# Patient Record
Sex: Female | Born: 1945 | Race: White | Hispanic: No | Smoking: Current every day smoker
Health system: Southern US, Community
[De-identification: ages and names within clinical notes are randomized; demographics above are authoritative.]

## PROBLEM LIST (undated history)

## (undated) DIAGNOSIS — E785 Hyperlipidemia, unspecified: Secondary | ICD-10-CM

## (undated) DIAGNOSIS — C787 Secondary malignant neoplasm of liver and intrahepatic bile duct: Secondary | ICD-10-CM

## (undated) DIAGNOSIS — F419 Anxiety disorder, unspecified: Secondary | ICD-10-CM

## (undated) DIAGNOSIS — I10 Essential (primary) hypertension: Secondary | ICD-10-CM

## (undated) HISTORY — DX: Essential (primary) hypertension: I10

## (undated) HISTORY — DX: Anxiety disorder, unspecified: F41.9

## (undated) HISTORY — DX: Hyperlipidemia, unspecified: E78.5

## (undated) HISTORY — DX: Secondary malignant neoplasm of liver and intrahepatic bile duct: C78.7

---

## 2018-02-15 ENCOUNTER — Encounter: Payer: Self-pay | Admitting: Hematology

## 2018-02-15 ENCOUNTER — Telehealth: Payer: Self-pay | Admitting: Hematology

## 2018-02-15 ENCOUNTER — Other Ambulatory Visit: Payer: Self-pay | Admitting: Hematology

## 2018-02-15 DIAGNOSIS — C787 Secondary malignant neoplasm of liver and intrahepatic bile duct: Secondary | ICD-10-CM

## 2018-02-15 DIAGNOSIS — C7B8 Other secondary neuroendocrine tumors: Secondary | ICD-10-CM | POA: Insufficient documentation

## 2018-02-15 DIAGNOSIS — I10 Essential (primary) hypertension: Secondary | ICD-10-CM

## 2018-02-15 HISTORY — DX: Essential (primary) hypertension: I10

## 2018-02-15 HISTORY — DX: Secondary malignant neoplasm of liver and intrahepatic bile duct: C78.7

## 2018-02-15 NOTE — Telephone Encounter (Signed)
Spoke to the pt's daughter Morey Hummingbird and scheduled Emily Harper to see Dr. Maylon Peppers on 9/19 at [facility] to arrive 30 minutes early. I provided the address to our office.

## 2018-02-17 ENCOUNTER — Telehealth: Payer: Self-pay | Admitting: Hematology

## 2018-02-17 ENCOUNTER — Telehealth: Payer: Self-pay

## 2018-02-17 ENCOUNTER — Other Ambulatory Visit: Payer: Self-pay | Admitting: Hematology

## 2018-02-17 DIAGNOSIS — C787 Secondary malignant neoplasm of liver and intrahepatic bile duct: Secondary | ICD-10-CM

## 2018-02-17 NOTE — Telephone Encounter (Signed)
Spoke to pts sister regarding upcoming appts per 9/13 sch message

## 2018-02-17 NOTE — Telephone Encounter (Signed)
Called and spoke with patients daughter to advise that patient to come in at 8 AM for a lab appointment prior to her appointment with Dr. Maylon Peppers. Also requested that her son drop off imaging disc on Monday 02/20/18 so that they can be loaded into PACS by 02/23/18. Patient is moving to [place] to live with son but daughter would like to be the contact for patient updates after appointments.

## 2018-02-20 ENCOUNTER — Inpatient Hospital Stay
Admission: RE | Admit: 2018-02-20 | Discharge: 2018-02-20 | Disposition: A | Discharge status: Home / Self Care | Payer: Self-pay | Source: Ambulatory Visit | Attending: Hematology | Admitting: Hematology

## 2018-02-20 ENCOUNTER — Other Ambulatory Visit: Payer: Self-pay | Admitting: Hematology

## 2018-02-20 ENCOUNTER — Ambulatory Visit
Admission: RE | Admit: 2018-02-20 | Discharge: 2018-02-20 | Disposition: A | Discharge status: Home / Self Care | Payer: Self-pay | Source: Ambulatory Visit | Attending: Hematology | Admitting: Hematology

## 2018-02-20 DIAGNOSIS — C801 Malignant (primary) neoplasm, unspecified: Secondary | ICD-10-CM

## 2018-02-23 ENCOUNTER — Inpatient Hospital Stay: Payer: Medicare Other

## 2018-02-23 ENCOUNTER — Encounter: Payer: Self-pay | Admitting: Hematology

## 2018-02-23 ENCOUNTER — Inpatient Hospital Stay: Payer: Medicare Other | Attending: Hematology | Admitting: Hematology

## 2018-02-23 ENCOUNTER — Other Ambulatory Visit: Payer: Self-pay | Admitting: Hematology

## 2018-02-23 ENCOUNTER — Other Ambulatory Visit: Payer: Self-pay

## 2018-02-23 ENCOUNTER — Telehealth: Payer: Self-pay | Admitting: Hematology

## 2018-02-23 VITALS — BP 105/71 | HR 80 | Temp 97.9°F | Resp 18 | Ht 65.5 in | Wt 104.0 lb

## 2018-02-23 DIAGNOSIS — I10 Essential (primary) hypertension: Secondary | ICD-10-CM

## 2018-02-23 DIAGNOSIS — C801 Malignant (primary) neoplasm, unspecified: Secondary | ICD-10-CM | POA: Insufficient documentation

## 2018-02-23 DIAGNOSIS — Z720 Tobacco use: Secondary | ICD-10-CM | POA: Insufficient documentation

## 2018-02-23 DIAGNOSIS — K861 Other chronic pancreatitis: Secondary | ICD-10-CM | POA: Insufficient documentation

## 2018-02-23 DIAGNOSIS — R634 Abnormal weight loss: Secondary | ICD-10-CM | POA: Insufficient documentation

## 2018-02-23 DIAGNOSIS — Z7289 Other problems related to lifestyle: Secondary | ICD-10-CM | POA: Diagnosis not present

## 2018-02-23 DIAGNOSIS — C787 Secondary malignant neoplasm of liver and intrahepatic bile duct: Secondary | ICD-10-CM

## 2018-02-23 DIAGNOSIS — K860 Alcohol-induced chronic pancreatitis: Secondary | ICD-10-CM | POA: Diagnosis not present

## 2018-02-23 DIAGNOSIS — R197 Diarrhea, unspecified: Secondary | ICD-10-CM | POA: Diagnosis not present

## 2018-02-23 DIAGNOSIS — R7989 Other specified abnormal findings of blood chemistry: Secondary | ICD-10-CM | POA: Diagnosis not present

## 2018-02-23 DIAGNOSIS — E785 Hyperlipidemia, unspecified: Secondary | ICD-10-CM | POA: Insufficient documentation

## 2018-02-23 DIAGNOSIS — F419 Anxiety disorder, unspecified: Secondary | ICD-10-CM | POA: Diagnosis not present

## 2018-02-23 LAB — CBC WITH DIFFERENTIAL (CANCER CENTER ONLY)
Basophils Absolute: 0.1 10*3/uL (ref 0.0–0.1)
Basophils Relative: 1 %
Eosinophils Absolute: 0.1 10*3/uL (ref 0.0–0.5)
Eosinophils Relative: 2 %
HCT: 43.1 % (ref 34.8–46.6)
Hemoglobin: 14.8 g/dL (ref 11.6–15.9)
LYMPHS ABS: 1.1 10*3/uL (ref 0.9–3.3)
Lymphocytes Relative: 17 %
MCH: 31.8 pg (ref 25.1–34.0)
MCHC: 34.4 g/dL (ref 31.5–36.0)
MCV: 92.6 fL (ref 79.5–101.0)
MONO ABS: 0.5 10*3/uL (ref 0.1–0.9)
Monocytes Relative: 8 %
Neutro Abs: 4.7 10*3/uL (ref 1.5–6.5)
Neutrophils Relative %: 72 %
Platelet Count: 201 10*3/uL (ref 145–400)
RBC: 4.66 MIL/uL (ref 3.70–5.45)
RDW: 14.1 % (ref 11.2–14.5)
WBC Count: 6.4 10*3/uL (ref 3.9–10.3)

## 2018-02-23 LAB — BASIC METABOLIC PANEL - CANCER CENTER ONLY
Anion gap: 9 (ref 5–15)
BUN: 19 mg/dL (ref 8–23)
CHLORIDE: 96 mmol/L — AB (ref 98–111)
CO2: 32 mmol/L (ref 22–32)
CREATININE: 1.44 mg/dL — AB (ref 0.44–1.00)
Calcium: 10.5 mg/dL — ABNORMAL HIGH (ref 8.9–10.3)
GFR, Est AFR Am: 42 mL/min — ABNORMAL LOW (ref >60)
GFR, Estimated: 36 mL/min — ABNORMAL LOW (ref >60)
Glucose, Bld: 98 mg/dL (ref 70–99)
Potassium: 4.2 mmol/L (ref 3.5–5.1)
SODIUM: 137 mmol/L (ref 135–145)

## 2018-02-23 LAB — PROTIME-INR
INR: 0.92
PROTHROMBIN TIME: 12.3 s (ref 11.4–15.2)

## 2018-02-23 LAB — APTT: aPTT: 30 seconds (ref 24–36)

## 2018-02-23 LAB — HEPATIC FUNCTION PANEL
ALK PHOS: 73 U/L (ref 38–126)
ALT: 21 U/L (ref 0–44)
AST: 27 U/L (ref 15–41)
Albumin: 4.2 g/dL (ref 3.5–5.0)
BILIRUBIN INDIRECT: 0.6 mg/dL (ref 0.3–0.9)
Bilirubin, Direct: 0.1 mg/dL (ref 0.0–0.2)
Total Bilirubin: 0.7 mg/dL (ref 0.3–1.2)
Total Protein: 6.7 g/dL (ref 6.5–8.1)

## 2018-02-23 NOTE — Assessment & Plan Note (Signed)
Patient's creatinine was noted to be mildly elevated 1.4 today. There is no external record of patient's serum chemistry for comparison. I encourage patient to maintain oral hydration, and will monitor the patient's renal function periodically.

## 2018-02-23 NOTE — Progress Notes (Signed)
[facility] NOTE  Patient Care Team: Patient, No Pcp Per as PCP - General (General Practice)  ASSESSMENT & PLAN:  Liver metastases ([place]) I reviewed the patient's external records, including colonoscopy results, CT scans, and PET scan, in detail and the collaborated with the patient and her daughter. Patient reports weight loss of approximately 30 pounds over the past 4 months and persistent diarrhea 5 to 7 times a day.  Interestingly, PET scan did not demonstrate any FDG uptake in the hepatic or splenic lesions. Based on the patient's clinical symptoms and the absence of FDG uptake in hepatic lesions on PET scan, this suggests possible neuroendocrine tumor. I will order CT-guided biopsy of the liver lesion to confirm pathology. I discussed in detail with the patient about some of the risks of liver biopsy, including bleeding, risk infection, and pain.  Patient expressed understanding and was agreeable to proceed with the biopsy. I will also order serum chromogranin A and serotonin levels.  Pending the results of the biopsy, if it does demonstrate neuroendocrine tumor, we will try to coordinate an octreotide scan prior to patient's return visit.  Elevated serum creatinine Patient's creatinine was noted to be mildly elevated 1.4 today. There is no external record of patient's serum chemistry for comparison. I encourage patient to maintain oral hydration, and will monitor the patient's renal function periodically.  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 She denies any heavy alcohol use in the past 2 years. I emphasized with the patient about the importance of alcohol abstinence, given her history of chronic pancreatitis.  Tobacco use I spent some time counseling the patient the importance of tobacco cessation. We discussed common strategies including nicotine patches, Tobacco Quit-line, and other nicotine  replacement products to assist in her effort to quit  she appears interested in cutting back her tobacco use. We will continue to assess her willingness to quit in future visits.   Anxiety Patient takes Valium 3 times a day for her anxiety, which is currently well controlled. I encourage patient to continue to follow-up with her primary care physician closely for the management.   Orders Placed This Encounter  Procedures  . CT BIOPSY    Standing Status:   Future    Standing Expiration Date:   02/23/2019    Order Specific Question:   Lab orders requested (DO NOT place separate lab orders, these will be automatically ordered during procedure specimen collection):    Answer:   Cytology - Non Pap    Comments:   Liver biopsy     Order Specific Question:   Lab orders requested (DO NOT place separate lab orders, these will be automatically ordered during procedure specimen collection):    Answer:   Surgical Pathology    Order Specific Question:   Reason for Exam (SYMPTOM  OR DIAGNOSIS REQUIRED)    Answer:   Liver lesions concerning for metastatic disease    Order Specific Question:   Preferred imaging location?    Answer:   [facility]    Order Specific Question:   Radiology Contrast Protocol - do NOT remove file path    Answer:   [email or web address]  . Chromogranin A    Standing Status:   Future    Number of Occurrences:   1    Standing Expiration Date:   02/23/2019  . Serotonin serum    Standing Status:   Future    Number of Occurrences:  1    Standing Expiration Date:   02/23/2019    A total of more than 60 minutes were spent face-to-face with the patient during this encounter and over half of that time was spent on counseling and coordination of care as outlined above.   All questions were answered. The patient knows to call the clinic with any problems, questions or concerns.  Tentatively return to clinic in 3 weeks to follow-up liver biopsy.  If  liver biopsy demonstrates neuroendocrine tumor, we will try to coordinate an octreotide scan prior to her return visit.  Tish Men, MD 02/23/2018 10:09 AM   CHIEF COMPLAINTS/PURPOSE OF CONSULTATION:  "I am here for my liver cancer"  HISTORY OF PRESENTING ILLNESS:  Emily Harper 72 y.o. female is here because of recently diagnosed hepatic metastases of unknown origin.  Patient reports that primary care physician in [place] first noted her to be having weight loss in May 2019.  During the same month, patient began to experience new onset diarrhea, 3-5 times per day, without any associated abdominal pain, nausea, vomiting, hematochezia, or melena.  The frequency of diarrhea continue to worsen over time and is approximately 5-7 times per day, moderate volume.  She has not taken any over-the-counter medicine for the symptom.  She reports associated weight loss of 30 pounds over the past 4 months, but denies any fever, chill, night sweats, lymphadenopathy, rash, abnormal sweating, or other GI symptoms.  Her appetite has been decreasing due to her fear of eating, which can trigger diarrhea.  I have reviewed her chart and materials related to her cancer extensively and collaborated history with the patient. Summary of oncologic history is as follows:   Liver metastases ([place])   10/05/2017 Miscellaneous    PCP noted pt to be losing weight; pt began to experience new onset diarrhea, 3-5x/day, moderate volume, no hematochezia or melena     12/13/2017 Procedure    Colonoscopy Normal except polyps removed from the cecum and descending colon (path showing tubular adenomas)    12/30/2017 Imaging    CT abdomen/pelvis without contrast (for diarrhea, weight loss) Multiple hypodense lesions within the liver, suspicious for metastatic disease.  Suspect circumferential wall thickening/mass lesion involving the cecum and ascending colon.  Bilateral renal cysts.  Mild anterior bladder wall thickening.   Subcentimeter sclerotic lesion in the sacrum, indeterminate.     01/12/2018 Imaging    CT abdomen/pelvis with contrast  Multiple heterogeneously enhancing hepatic lesions consistent with metastases (largest measuring 4cm in the right lobe) Normal appearing pancreas    01/24/2018 Imaging    CT chest w/ contrast  Several 62mm lung nodules, indeterminate significance     01/30/2018 Imaging    FDG PET: No focal abnormal FDG activity corresponding with the hepatic parenchyma.      MEDICAL HISTORY:  Past Medical History:  Diagnosis Date  . Anxiety   . Hepatic metastases ([place]) 02/15/2018  . Hyperlipemia   . Hypertension 02/15/2018  . Liver metastases ([place]) 02/15/2018    SURGICAL HISTORY: History reviewed. No pertinent surgical history.  SOCIAL HISTORY: Social History   Socioeconomic History  . Marital status: Divorced    Spouse name: Not on file  . Number of children: Not on file  . Years of education: Not on file  . Highest education level: Not on file  Occupational History  . Not on file  Social Needs  . Financial resource strain: Not on file  . Food insecurity:    Worry: Not on file  Inability: Not on file  . Transportation needs:    Medical: Not on file    Non-medical: Not on file  Tobacco Use  . Smoking status: Current Every Day Smoker    Packs/day: 1.00    Types: Cigarettes  . Smokeless tobacco: Never Used  Substance and Sexual Activity  . Alcohol use: Not Currently  . Drug use: Yes    Types: Benzodiazepines  . Sexual activity: Not on file  Lifestyle  . Physical activity:    Days per week: Not on file    Minutes per session: Not on file  . Stress: Not on file  Relationships  . Social connections:    Talks on phone: Not on file    Gets together: Not on file    Attends religious service: Not on file    Active member of club or organization: Not on file    Attends meetings of clubs or organizations: Not on file    Relationship status: Not on file  .  Intimate partner violence:    Fear of current or ex partner: Not on file    Emotionally abused: Not on file    Physically abused: Not on file    Forced sexual activity: Not on file  Other Topics Concern  . Not on file  Social History Narrative  . Not on file    FAMILY HISTORY: History reviewed. No pertinent family history.  ALLERGIES:  is allergic to penicillins.  MEDICATIONS:  Current Outpatient Medications  Medication Sig Dispense Refill  . diazepam (VALIUM) 10 MG tablet Take 10 mg by mouth every 8 (eight) hours as needed for anxiety.    . simvastatin (ZOCOR) 40 MG tablet Take 40 mg by mouth daily.    Marland Kitchen triamterene-hydrochlorothiazide (MAXZIDE-25) 37.5-25 MG tablet Take 1 tablet by mouth daily.     No current facility-administered medications for this visit.     REVIEW OF SYSTEMS:   Constitutional: ( - ) fevers, ( - )  chills , ( - ) night sweats Eyes: ( - ) blurriness of vision, ( - ) double vision, ( - ) watery eyes Ears, nose, mouth, throat, and face: ( - ) mucositis, ( - ) sore throat Respiratory: ( - ) cough, ( - ) dyspnea, ( - ) wheezes Cardiovascular: ( - ) palpitation, ( - ) chest discomfort, ( - ) lower extremity swelling Skin: ( - ) abnormal skin rashes Lymphatics: ( - ) new lymphadenopathy, ( - ) easy bruising Neurological: ( - ) numbness, ( - ) tingling, ( - ) new weaknesses Behavioral/Psych: ( - ) mood change, ( - ) new changes, ( + ) anxiety  All other systems were reviewed with the patient and are negative.  PHYSICAL EXAMINATION: ECOG PERFORMANCE STATUS: 0 - Asymptomatic  Vitals:   02/23/18 0858  BP: 105/71  Pulse: 80  Resp: 18  Temp: 97.9 F (36.6 C)  SpO2: 98%   Filed Weights   02/23/18 0858  Weight: 104 lb (47.2 kg)    GENERAL: alert, no distress and comfortable, thin, older than stated age SKIN:  no rashes or significant lesions, chronic sun-damaged skin EYES: conjunctiva are pink and non-injected, sclera clear OROPHARYNX: no exudate, no  erythema; lips, buccal mucosa, and tongue normal  NECK: supple, non-tender LYMPH:  no palpable lymphadenopathy in the cervical, axillary or inguinal LUNGS: clear to auscultation and percussion with normal breathing effort HEART: regular rate & rhythm and no murmurs and no lower extremity edema ABDOMEN: soft, non-tender, non-distended,  normal bowel sounds Musculoskeletal: no cyanosis of digits and no clubbing  PSYCH: alert & oriented x 3, fluent speech NEURO: no focal motor/sensory deficits  LABORATORY DATA:  I have reviewed the data as listed Lab Results  Component Value Date   WBC 6.4 02/23/2018   HGB 14.8 02/23/2018   HCT 43.1 02/23/2018   MCV 92.6 02/23/2018   PLT 201 02/23/2018   Lab Results  Component Value Date   NA 137 02/23/2018   K 4.2 02/23/2018   CL 96 (L) 02/23/2018   CO2 32 02/23/2018    RADIOGRAPHIC STUDIES: I have personally reviewed the radiological images as listed and agreed with the findings in the report. Ct Outside Films Chest  Result Date: 02/20/2018 This examination belongs to an outside facility and is stored here for comparison purposes only.  Contact the originating outside institution for any associated report or interpretation.   PATHOLOGY: I have reviewed the pathology reports as listed above.

## 2018-02-23 NOTE — Telephone Encounter (Signed)
Appts scheduled avs/calendar printed per 9/19 los

## 2018-02-23 NOTE — Assessment & Plan Note (Signed)
I reviewed the patient's external records, including colonoscopy results, CT scans, and PET scan, in detail and the collaborated with the patient and her daughter. Patient reports weight loss of approximately 30 pounds over the past 4 months and persistent diarrhea 5 to 7 times a day.  Interestingly, PET scan did not demonstrate any FDG uptake in the hepatic or splenic lesions. Based on the patient's clinical symptoms and the absence of FDG uptake in hepatic lesions on PET scan, this suggests possible neuroendocrine tumor. I will order CT-guided biopsy of the liver lesion to confirm pathology. I discussed in detail with the patient about some of the risks of liver biopsy, including bleeding, risk infection, and pain.  Patient expressed understanding and was agreeable to proceed with the biopsy. I will also order serum chromogranin A and serotonin levels.  Pending the results of the biopsy, if it does demonstrate neuroendocrine tumor, we will try to coordinate an octreotide scan prior to patient's return visit.

## 2018-02-23 NOTE — Assessment & Plan Note (Signed)
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 She denies any heavy alcohol use in the past 2 years. I emphasized with the patient about the importance of alcohol abstinence, given her history of chronic pancreatitis.

## 2018-02-23 NOTE — Assessment & Plan Note (Signed)
I spent some time counseling the patient the importance of tobacco cessation. We discussed common strategies including nicotine patches, Tobacco Quit-line, and other nicotine replacement products to assist in her effort to quit  she appears interested in cutting back her tobacco use. We will continue to assess her willingness to quit in future visits.

## 2018-02-23 NOTE — Assessment & Plan Note (Signed)
Patient takes Valium 3 times a day for her anxiety, which is currently well controlled. I encourage patient to continue to follow-up with her primary care physician closely for the management.

## 2018-02-27 ENCOUNTER — Other Ambulatory Visit: Payer: Self-pay | Admitting: Hematology

## 2018-02-27 ENCOUNTER — Encounter: Payer: Self-pay | Admitting: Hematology

## 2018-02-27 LAB — CHROMOGRANIN A: Chromogranin A: 477 nmol/L — ABNORMAL HIGH (ref 0–5)

## 2018-02-27 NOTE — Telephone Encounter (Signed)
This encounter was created in error - please disregard.

## 2018-02-28 LAB — SEROTONIN SERUM: Serotonin, Serum: 2583 ng/mL — ABNORMAL HIGH (ref 0–420)

## 2018-03-07 ENCOUNTER — Ambulatory Visit (HOSPITAL_COMMUNITY): Payer: Medicare Other

## 2018-03-07 ENCOUNTER — Other Ambulatory Visit: Payer: Self-pay | Admitting: Student

## 2018-03-07 ENCOUNTER — Other Ambulatory Visit: Payer: Self-pay | Admitting: Radiology

## 2018-03-08 ENCOUNTER — Ambulatory Visit (HOSPITAL_COMMUNITY)
Admission: RE | Admit: 2018-03-08 | Discharge: 2018-03-08 | Disposition: A | Discharge status: Home / Self Care | Payer: Medicare Other | Source: Ambulatory Visit | Attending: Hematology | Admitting: Hematology

## 2018-03-08 ENCOUNTER — Encounter (HOSPITAL_COMMUNITY): Payer: Self-pay

## 2018-03-08 DIAGNOSIS — C787 Secondary malignant neoplasm of liver and intrahepatic bile duct: Secondary | ICD-10-CM | POA: Insufficient documentation

## 2018-03-08 DIAGNOSIS — Z880 Allergy status to penicillin: Secondary | ICD-10-CM | POA: Diagnosis not present

## 2018-03-08 DIAGNOSIS — K769 Liver disease, unspecified: Secondary | ICD-10-CM | POA: Diagnosis present

## 2018-03-08 DIAGNOSIS — C7A8 Other malignant neuroendocrine tumors: Secondary | ICD-10-CM | POA: Insufficient documentation

## 2018-03-08 DIAGNOSIS — F17210 Nicotine dependence, cigarettes, uncomplicated: Secondary | ICD-10-CM | POA: Insufficient documentation

## 2018-03-08 DIAGNOSIS — F419 Anxiety disorder, unspecified: Secondary | ICD-10-CM | POA: Diagnosis not present

## 2018-03-08 DIAGNOSIS — I10 Essential (primary) hypertension: Secondary | ICD-10-CM | POA: Insufficient documentation

## 2018-03-08 DIAGNOSIS — E785 Hyperlipidemia, unspecified: Secondary | ICD-10-CM | POA: Insufficient documentation

## 2018-03-08 DIAGNOSIS — Z79899 Other long term (current) drug therapy: Secondary | ICD-10-CM | POA: Insufficient documentation

## 2018-03-08 MED ORDER — FENTANYL CITRATE (PF) 100 MCG/2ML IJ SOLN
INTRAMUSCULAR | Status: AC | PRN
  Administered 2018-03-08 (×2): 25 ug via INTRAVENOUS

## 2018-03-08 MED ORDER — MIDAZOLAM HCL 2 MG/2ML IJ SOLN
INTRAMUSCULAR | Status: AC | PRN
  Administered 2018-03-08 (×2): 0.5 mg via INTRAVENOUS

## 2018-03-08 MED ORDER — SODIUM CHLORIDE 0.9 % IV SOLN
INTRAVENOUS | Status: AC | PRN
  Administered 2018-03-08: 10 mL/h via INTRAVENOUS

## 2018-03-08 MED ORDER — LIDOCAINE-EPINEPHRINE 1 %-1:100000 IJ SOLN
INTRAMUSCULAR | Status: AC
  Filled 2018-03-08: qty 1

## 2018-03-08 MED ORDER — GELATIN ABSORBABLE 12-7 MM EX MISC
CUTANEOUS | Status: AC
  Filled 2018-03-08: qty 1

## 2018-03-08 MED ORDER — SODIUM CHLORIDE 0.9 % IV SOLN: INTRAVENOUS | Status: DC

## 2018-03-08 MED ORDER — FENTANYL CITRATE (PF) 100 MCG/2ML IJ SOLN
INTRAMUSCULAR | Status: AC
  Filled 2018-03-08: qty 2

## 2018-03-08 MED ORDER — MIDAZOLAM HCL 2 MG/2ML IJ SOLN
INTRAMUSCULAR | Status: AC
  Filled 2018-03-08: qty 2

## 2018-03-08 NOTE — Sedation Documentation (Signed)
Patient is resting comfortably. 

## 2018-03-08 NOTE — Sedation Documentation (Signed)
Patient denies pain and is resting comfortably.  

## 2018-03-08 NOTE — Procedures (Signed)
Pre Procedure Dx: Liver lesions Post Procedural Dx: Same  Technically successful US guided biopsy of dominant mass within the posterior inferior aspect of the right lobe of the liver.   EBL: None  No immediate complications.   Ronny Bacon, MD Pager #: [phone]

## 2018-03-08 NOTE — H&P (Signed)
Chief Complaint: Patient was seen in consultation today for liver lesion biopsy at the request of Dunn Loring  Referring Physician(s): Zhao,Yan  Supervising Physician: Sandi Mariscal  Patient Status: [facility]  History of Present Illness: Emily Harper is a 72 y.o. female being worked up for liver lesions concerning for metastatic process. She is referred for biopsy PMHx, meds, labs, imaging, allergies reviewed. Feels well, no recent fevers, chills, illness. Has been NPO today as directed. Family at bedside.   Past Medical History:  Diagnosis Date  . Anxiety   . Hepatic metastases ([place]) 02/15/2018  . Hyperlipemia   . Hypertension 02/15/2018  . Liver metastases ([place]) 02/15/2018    History reviewed. No pertinent surgical history.  Allergies: Penicillins  Medications: Prior to Admission medications   Medication Sig Start Date End Date Taking? Authorizing Provider  diazepam (VALIUM) 10 MG tablet Take 10 mg by mouth 3 (three) times daily.    Yes [provider]  latanoprost (XALATAN) 0.005 % ophthalmic solution Place 1 drop into both eyes at bedtime.   Yes [provider]  simvastatin (ZOCOR) 40 MG tablet Take 40 mg by mouth at bedtime.    Yes [provider]  triamterene-hydrochlorothiazide (MAXZIDE-25) 37.5-25 MG tablet Take 1 tablet by mouth daily.   Yes [provider]     History reviewed. No pertinent family history.  Social History   Socioeconomic History  . Marital status: Divorced    Spouse name: Not on file  . Number of children: Not on file  . Years of education: Not on file  . Highest education level: Not on file  Occupational History  . Not on file  Social Needs  . Financial resource strain: Not on file  . Food insecurity:    Worry: Not on file    Inability: Not on file  . Transportation needs:    Medical: Not on file    Non-medical: Not on file  Tobacco Use  . Smoking status: Current Every Day Smoker   Packs/day: 1.00    Types: Cigarettes  . Smokeless tobacco: Never Used  Substance and Sexual Activity  . Alcohol use: Not Currently  . Drug use: Yes    Types: Benzodiazepines  . Sexual activity: Not on file  Lifestyle  . Physical activity:    Days per week: Not on file    Minutes per session: Not on file  . Stress: Not on file  Relationships  . Social connections:    Talks on phone: Not on file    Gets together: Not on file    Attends religious service: Not on file    Active member of club or organization: Not on file    Attends meetings of clubs or organizations: Not on file    Relationship status: Not on file  Other Topics Concern  . Not on file  Social History Narrative  . Not on file     Review of Systems: A 12 point ROS discussed and pertinent positives are indicated in the HPI above.  All other systems are negative.  Review of Systems  Vital Signs: BP 105/70   Pulse 80   Temp 97.9 F (36.6 C) (Oral)   Resp 16   Ht 5' 5.5" (1.664 m)   Wt 45.8 kg   SpO2 100%   BMI 16.55 kg/m   Physical Exam  Constitutional: She is oriented to person, place, and time. She appears well-developed. No distress.  HENT:  Head: Normocephalic.  Mouth/Throat: Oropharynx is  clear and moist.  Neck: Normal range of motion. No JVD present.  Cardiovascular: Normal rate, regular rhythm and normal heart sounds.  Pulmonary/Chest: Effort normal and breath sounds normal. No respiratory distress.  Abdominal: Soft. She exhibits no distension and no mass. There is no tenderness.  Neurological: She is oriented to person, place, and time.  Skin: Skin is warm and dry.  Psychiatric: She has a normal mood and affect. Judgment normal.    Imaging: No results found.  Labs:  CBC: Recent Labs    02/23/18 0835  WBC 6.4  HGB 14.8  HCT 43.1  PLT 201    COAGS: Recent Labs    02/23/18 0835  INR 0.92  APTT 30    BMP: Recent Labs    02/23/18 0835  NA 137  K 4.2  CL 96*  CO2 32    GLUCOSE 98  BUN 19  CALCIUM 10.5*  CREATININE 1.44*  GFRNONAA 36*  GFRAA 42*    LIVER FUNCTION TESTS: Recent Labs    02/23/18 0835  BILITOT 0.7  AST 27  ALT 21  ALKPHOS 73  PROT 6.7  ALBUMIN 4.2    TUMOR MARKERS: Recent Labs    02/23/18 1007  CHROMGRNA 477*    Assessment and Plan: Liver lesions For US guided biopsy Labs reviewed, ok. Risks and benefits discussed with the patient including, but not limited to bleeding, infection, damage to adjacent structures or low yield requiring additional tests.  All of the patient's questions were answered, patient is agreeable to proceed. Consent signed and in chart.    Thank you for this interesting consult.  I greatly enjoyed meeting Emily Harper and look forward to participating in their care.  A copy of this report was sent to the requesting provider on this date.  Electronically Signed: Ascencion Dike, PA-C 03/08/2018, 12:29 PM   I spent a total of 20 minutes in face to face in clinical consultation, greater than 50% of which was counseling/coordinating care for liver biopsy

## 2018-03-08 NOTE — Discharge Instructions (Signed)
Liver Biopsy °The liver is a large organ in the upper right-hand side of your abdomen. A liver biopsy is a procedure in which a tissue sample is taken from the liver and examined under a microscope. The procedure is done to confirm a suspected problem. °There are three types of liver biopsies: °· Percutaneous. In this type, an incision is made in your abdomen. The sample is removed through the incision with a needle. °· Laparoscopic. In this type, several incisions are made in the abdomen. A tiny camera is passed through one of the incisions to help guide the health care provider. The sample is removed through the other incision or incisions. °· Transjugular. In this type, an incision is made in the neck. A tube is passed through the incision to the liver. The sample is removed through the tube with a needle. ° °Tell a health care provider about: °· Any allergies you have. °· All medicines you are taking, including vitamins, herbs, eye drops, creams, and over-the-counter medicines. °· Any problems you or family members have had with anesthetic medicines. °· Any blood disorders you have. °· Any surgeries you have had. °· Any medical conditions you have. °· Possibility of pregnancy, if this applies. °What are the risks? °Generally, this is a safe procedure. However, problems can occur and include: °· Bleeding. °· Infection. °· Bruising. °· Collapsed lung. °· Leak of digestive juices (bile) from the liver or gallbladder. °· Problems with heart rhythm. °· Pain at the biopsy site or in the right shoulder. °· Low blood pressure (hypotension). °· Injury to nearby organs or tissues. ° °What happens before the procedure? °· Your health care provider may do some blood or urine tests. These will help your health care provider learn how well your kidneys and liver are working and how well your blood clots. °· Ask your health care provider if you will be able to go home the day of the procedure. Arrange for someone to take you  home and stay with you for at least 24 hours. °· Do not eat or drink anything after midnight on the night before the procedure or as directed by your health care provider. °· Ask your health care provider about: °? Changing or stopping your regular medicines. This is especially important if you are taking diabetes medicines or blood thinners. °? Taking medicines such as aspirin and ibuprofen. These medicines can thin your blood. Do not take these medicines before your procedure if your health care provider asks you not to. °What happens during the procedure? °Regardless of the type of biopsy that will be done, you will have an IV line placed. Through this line, you will receive fluids and medicine to relax you. If you will be having a laparoscopic biopsy, you may also receive medicine through this line to make you sleep during the procedure (general anesthetic). °Percutaneous Liver Biopsy °· You will positioned on your back, with your right hand over your head. °· A health care provider will locate your liver by tapping and pressing on the right side of your abdomen or with the help of an ultrasound machine or CT scan. °· An area at the bottom of your last right rib will be numbed. °· An incision will be made in the numbed area. °· The biopsy needle will be inserted into the incision. °· Several samples of liver tissue will be taken with the biopsy needle. You will be asked to hold your breath as each sample is taken. °Laparoscopic Liver   Biopsy  You will be positioned on your back.  Several small incisions will be made in your abdomen.  Your doctor will pass a tiny camera through one incision. The camera will allow the liver to be viewed on a TV monitor in the operating room.  Tools will be passed through the other incision or incisions. These tools will be used to remove samples of liver tissue. Transjugular Liver Biopsy  You will be positioned on your back on an X-ray table, with your head turned to  your left.  An area on your neck just over your jugular vein will be numbed.  An incision will be made in the numbed area.  A tiny tube will be inserted through the incision. It will be pushed through the jugular vein to a blood vessel in the liver called the hepatic vein.  Dye will be inserted through the tube, and X-rays will be taken. The dye will make the blood vessels in the liver light up on the X-rays.  The biopsy needle will be pushed through the tube until it reaches the liver.  Samples of liver tissue will be taken with the biopsy needle.  The needle and the tube will be removed. After the samples are obtained, the incision or incisions will be closed. What happens after the procedure?  You will be taken to a recovery area.  You may have to lie on your right side for 1-2 hours. This will prevent bleeding from the biopsy site.  Your progress will be watched. Your blood pressure, pulse, and the biopsy site will be checked often.  You may have some pain or feel sick. If this happens, tell your health care provider.  As you begin to feel better, you will be offered ice and beverages.  You may be allowed to go home when the medicines have worn off and you can walk, drink, eat, and use the bathroom. This information is not intended to replace advice given to you by your health care provider. Make sure you discuss any questions you have with your health care provider. Document Released: 08/14/2003 Document Revised: 10/27/2015 Document Reviewed: 07/20/2013 Elsevier Interactive Patient Education  [employer].

## 2018-03-09 ENCOUNTER — Telehealth: Payer: Self-pay | Admitting: Hematology

## 2018-03-09 NOTE — Telephone Encounter (Signed)
YZ schedule change from [facility] to [facility] 10/10. Moved 10/10 appointment to 10/9. Left message and mailed schedule.

## 2018-03-10 ENCOUNTER — Emergency Department (HOSPITAL_COMMUNITY)
Admission: EM | Admit: 2018-03-10 | Discharge: 2018-03-10 | Disposition: A | Discharge status: Home / Self Care | Payer: Medicare Other | Attending: Emergency Medicine | Admitting: Emergency Medicine

## 2018-03-10 ENCOUNTER — Emergency Department (HOSPITAL_COMMUNITY): Payer: Medicare Other

## 2018-03-10 ENCOUNTER — Other Ambulatory Visit: Payer: Self-pay | Admitting: Hematology

## 2018-03-10 ENCOUNTER — Encounter (HOSPITAL_COMMUNITY): Payer: Self-pay | Admitting: Emergency Medicine

## 2018-03-10 ENCOUNTER — Other Ambulatory Visit: Payer: Self-pay

## 2018-03-10 DIAGNOSIS — G8918 Other acute postprocedural pain: Secondary | ICD-10-CM | POA: Diagnosis not present

## 2018-03-10 DIAGNOSIS — R1011 Right upper quadrant pain: Secondary | ICD-10-CM | POA: Diagnosis not present

## 2018-03-10 DIAGNOSIS — I10 Essential (primary) hypertension: Secondary | ICD-10-CM | POA: Diagnosis not present

## 2018-03-10 DIAGNOSIS — F17210 Nicotine dependence, cigarettes, uncomplicated: Secondary | ICD-10-CM | POA: Diagnosis not present

## 2018-03-10 DIAGNOSIS — Z79899 Other long term (current) drug therapy: Secondary | ICD-10-CM | POA: Diagnosis not present

## 2018-03-10 DIAGNOSIS — C787 Secondary malignant neoplasm of liver and intrahepatic bile duct: Secondary | ICD-10-CM

## 2018-03-10 LAB — BASIC METABOLIC PANEL
ANION GAP: 13 (ref 5–15)
BUN: 19 mg/dL (ref 8–23)
CO2: 28 mmol/L (ref 22–32)
Calcium: 9.7 mg/dL (ref 8.9–10.3)
Chloride: 101 mmol/L (ref 98–111)
Creatinine, Ser: 1.51 mg/dL — ABNORMAL HIGH (ref 0.44–1.00)
GFR calc Af Amer: 40 mL/min — ABNORMAL LOW (ref >60)
GFR, EST NON AFRICAN AMERICAN: 34 mL/min — AB (ref >60)
GLUCOSE: 112 mg/dL — AB (ref 70–99)
POTASSIUM: 3.7 mmol/L (ref 3.5–5.1)
Sodium: 142 mmol/L (ref 135–145)

## 2018-03-10 LAB — CBC
HEMATOCRIT: 43.5 % (ref 36.0–46.0)
Hemoglobin: 14.8 g/dL (ref 12.0–15.0)
MCH: 31.6 pg (ref 26.0–34.0)
MCHC: 34 g/dL (ref 30.0–36.0)
MCV: 92.9 fL (ref 78.0–100.0)
PLATELETS: 193 10*3/uL (ref 150–400)
RBC: 4.68 MIL/uL (ref 3.87–5.11)
RDW: 14 % (ref 11.5–15.5)
WBC: 9.2 10*3/uL (ref 4.0–10.5)

## 2018-03-10 MED ORDER — IOHEXOL 300 MG/ML  SOLN
75.0000 mL | Freq: Once | INTRAMUSCULAR | Status: AC | PRN
  Administered 2018-03-10: 75 mL via INTRAVENOUS

## 2018-03-10 MED ORDER — MORPHINE SULFATE (PF) 4 MG/ML IV SOLN
4.0000 mg | Freq: Once | INTRAVENOUS | Status: AC
  Administered 2018-03-10: 4 mg via INTRAVENOUS
  Filled 2018-03-10: qty 1

## 2018-03-10 MED ORDER — HYDROCODONE-ACETAMINOPHEN 5-325 MG PO TABS
1.0000 | ORAL_TABLET | Freq: Once | ORAL | Status: AC
  Administered 2018-03-10: 1 via ORAL
  Filled 2018-03-10: qty 1

## 2018-03-10 MED ORDER — SODIUM CHLORIDE 0.9 % IJ SOLN
INTRAMUSCULAR | Status: AC
  Filled 2018-03-10: qty 50

## 2018-03-10 NOTE — ED Provider Notes (Signed)
[place] DEPT Provider Note   CSN: [id number] Arrival date & time: 03/10/18  1062     History   Chief Complaint Chief Complaint  Patient presents with  . Surgical Pain    HPI Emily Harper is a 72 y.o. female.  72 year old female, daily smoker, with liver lesions, presents today status post liver biopsy.  Patient had her biopsy performed at [facility] on 10/2.  Since then she has been in severe pain, doubled over.  Patient notes her pain is in the right lateral lower ribs.  She denies any aggravating or alleviating factors of her pain.  She denies pending any rales on her abdomen, abdominal distention.  She denies any nausea, vomiting, fevers, chills.  She denies any chest pain or shortness of breath.  The history is provided by the patient.    Past Medical History:  Diagnosis Date  . Anxiety   . Hepatic metastases ([place]) 02/15/2018  . Hyperlipemia   . Hypertension 02/15/2018  . Liver metastases ([place]) 02/15/2018    Patient Active Problem List   Diagnosis Date Noted  . HTN (hypertension) 02/23/2018  . HLD (hyperlipidemia) 02/23/2018  . Elevated serum creatinine 02/23/2018  . Chronic pancreatitis ([place]) 02/23/2018  . Tobacco use 02/23/2018  . Anxiety 02/23/2018  . Liver metastases ([place]) 02/15/2018    History reviewed. No pertinent surgical history.   OB History   None      Home Medications    Prior to Admission medications   Medication Sig Start Date End Date Taking? Authorizing Provider  diazepam (VALIUM) 10 MG tablet Take 10 mg by mouth 3 (three) times daily.    Yes [provider]  latanoprost (XALATAN) 0.005 % ophthalmic solution Place 1 drop into both eyes at bedtime.   Yes [provider]  simvastatin (ZOCOR) 40 MG tablet Take 40 mg by mouth at bedtime.    Yes [provider]  triamterene-hydrochlorothiazide (MAXZIDE-25) 37.5-25 MG tablet Take 1 tablet by mouth daily.   Yes [provider]    Family History No family history on file.  Social History Social History   Tobacco Use  . Smoking status: Current Every Day Smoker    Packs/day: 1.00    Types: Cigarettes  . Smokeless tobacco: Never Used  Substance Use Topics  . Alcohol use: Not Currently  . Drug use: Yes    Types: Benzodiazepines     Allergies   Penicillins and Shellfish allergy   Review of Systems Review of Systems  Constitutional: Negative for chills and fever.  Respiratory: Negative for shortness of breath.   Cardiovascular: Negative for chest pain.  Gastrointestinal: Positive for abdominal pain (over right lateral ribs). Negative for nausea and vomiting.  Genitourinary: Negative for dysuria, flank pain and hematuria.  Musculoskeletal: Negative for back pain.     Physical Exam Updated Vital Signs BP 110/70   Pulse 68   Temp 97.8 F (36.6 C) (Oral)   Resp 16   SpO2 99%   Physical Exam  Constitutional: She is oriented to person, place, and time. She appears well-developed and well-nourished.  HENT:  Head: Normocephalic and atraumatic.  Eyes: Conjunctivae and EOM are normal.  Neck: Neck supple.  Cardiovascular: Normal rate, regular rhythm and normal heart sounds.  No murmur heard. Pulmonary/Chest: Effort normal and breath sounds normal. No respiratory distress. She has no wheezes. She has no rales.      Abdominal: Soft. Bowel sounds are normal. She exhibits no distension.  Musculoskeletal: Normal range of motion. She exhibits no tenderness or deformity.  Neurological: She is alert and oriented to person, place, and time.  Skin: Skin is warm and dry. No rash noted. No erythema.  Psychiatric: She has a normal mood and affect. Her behavior is normal.  Nursing note and vitals reviewed.    ED Treatments / Results  Labs (all labs ordered are listed, but only abnormal results are displayed) Labs Reviewed  BASIC METABOLIC PANEL - Abnormal; Notable for the following components:       Result Value   Glucose, Bld 112 (*)    Creatinine, Ser 1.51 (*)    GFR calc non Af Amer 34 (*)    GFR calc Af Amer 40 (*)    All other components within normal limits  CBC    EKG None  Radiology Ct Abdomen W Contrast  Result Date: 03/10/2018 CLINICAL DATA:  Recent liver biopsy, abdominal discomfort and pain for 2 days. EXAM: CT ABDOMEN WITH CONTRAST TECHNIQUE: Multidetector CT imaging of the abdomen was performed using the standard protocol following bolus administration of intravenous contrast. CONTRAST:  76mL OMNIPAQUE IOHEXOL 300 MG/ML  SOLN COMPARISON:  PET 01/27/2018 and CT chest 01/24/2018. FINDINGS: Lower chest: Calcified granuloma in the right lower lobe. Heart size normal. No pericardial or pleural effusion. Hepatobiliary: Heterogeneous lesions in the right hepatic lobe measure up to 3.9 cm. Probable subtle hypodense lesion in the left hepatic lobe (series 2, image 23). Periportal edema. Liver measures at least 21.8 cm but is incompletely imaged. Gallbladder is unremarkable. No biliary ductal dilatation. Pancreas: Negative. Spleen: There are 3 low-attenuation lesions in the spleen measuring up to 1.4 cm, indeterminate. Adrenals/Urinary Tract: Adrenal glands are unremarkable. Low-attenuation lesions in the kidneys measure up to 1.5 cm on the left and are likely cysts although definitive characterization is limited due to size. Stomach/Bowel: Stomach is decompressed. Visualized portions of the small bowel and colon are unremarkable. Vascular/Lymphatic: Atherosclerotic calcification of the arterial vasculature. Retroaortic left renal vein. No pathologically enlarged lymph nodes. Other: No free fluid. Mesenteries and peritoneum are grossly unremarkable. Musculoskeletal: Vague area of sclerosis in the right sacrum is unchanged. Degenerative changes in the spine. No worrisome lytic or sclerotic lesions. IMPRESSION: 1. No acute findings to explain the patient's pain. 2. Heterogeneous hypodense  hepatic lesions are worrisome for metastatic disease. 3. Splenic lesions are difficult to further characterize due to size. 4. Hepatomegaly. 5.  Aortic atherosclerosis (ICD10-170.0). 6. Vague area of sclerosis in the right sacrum. Continued attention on follow-up exams is warranted. Electronically Signed   By: Lorin Picket M.D.   On: 03/10/2018 10:38   US Biopsy (liver)  Result Date: 03/08/2018 INDICATION: No known primary, now with multiple liver lesions and masses worrisome for metastatic disease. Please from ultrasound-guided biopsy for tissue diagnostic purposes. EXAM: ULTRASOUND GUIDED LIVER LESION BIOPSY COMPARISON:  PET-CT-01/27/2018; chest CT-01/24/2018 MEDICATIONS: None ANESTHESIA/SEDATION: Fentanyl 50 mcg IV; Versed 1 mg IV Total Moderate Sedation time:  13 Minutes. The patient's level of consciousness and vital signs were monitored continuously by radiology nursing throughout the procedure under my direct supervision. COMPLICATIONS: None immediate. PROCEDURE: Informed written consent was obtained from the patient after a discussion of the risks, benefits and alternatives to treatment. The patient understands and consents the procedure. A timeout was performed prior to the initiation of the procedure. Ultrasound scanning was performed of the right upper abdominal quadrant demonstrates multiple predominantly hypoechoic lesions and masses scattered within the right lobe of the liver. Dominant hyperechoic approximately  4.2 x 4.0 cm mass within the posterior inferior aspect of the right lobe of the liver correlating with the dominant mass seen on preceding PET-CT image 146, series 3 was targeted for biopsy given lesion location and sonographic window. The procedure was planned. The right upper abdominal quadrant was prepped and draped in the usual sterile fashion. The overlying soft tissues were anesthetized with 1% lidocaine with epinephrine. A 17 gauge, 6.8 cm co-axial needle was advanced into a  peripheral aspect of the lesion. This was followed by 5 core biopsies with an 18 gauge core device under direct ultrasound guidance. The coaxial needle tract was embolized with a small amount of Gel-Foam slurry and superficial hemostasis was obtained with manual compression. Post procedural scanning was negative for definitive area of hemorrhage or additional complication. A dressing was placed. The patient tolerated the procedure well without immediate post procedural complication. IMPRESSION: Technically successful ultrasound guided core needle biopsy of dominant mass within the posterior inferior segment of the right lobe of the liver. Electronically Signed   By: Sandi Mariscal M.D.   On: 03/08/2018 16:06    Procedures Procedures (including critical care time)  Medications Ordered in ED Medications  sodium chloride 0.9 % injection (has no administration in time range)  morphine 4 MG/ML injection 4 mg (4 mg Intravenous Given 03/10/18 0941)  iohexol (OMNIPAQUE) 300 MG/ML solution 75 mL (75 mLs Intravenous Contrast Given 03/10/18 1018)     Initial Impression / Assessment and Plan / ED Course  I have reviewed the triage vital signs and the nursing notes.  Pertinent labs & imaging results that were available during my care of the patient were reviewed by me and considered in my medical decision making (see chart for details).     No acute findings on CT today. No evidence of post biopsy bleeding, abscess, cellulitis or acute process. Encouraged close follow-up with oncologist.  She states pain has improved at this time.  She states she has no pain at rest, only when she moves.  She is resting more comfortably in bed.  Patient in no acute distress, vital signs stable.  Will write for 1 more dose of pain medication before she goes home.  She is agreeable with this plan.  She will follow-up with her oncologist today.  At this time there does not appear to be any evidence of an acute emergency medical  condition and the patient appears stable for discharge with appropriate outpatient follow up.Diagnosis was discussed with patient who verbalizes understanding and is agreeable to discharge. Pt case discussed with Dr. Kathrynn Humble who agrees with my plan.   Final Clinical Impressions(s) / ED Diagnoses   Final diagnoses:  None    ED Discharge Orders    None       Etter Sjogren, PA-C 03/10/18 [place], Ankit, MD 03/11/18 [phone]

## 2018-03-10 NOTE — ED Notes (Signed)
Patient transported to CT 

## 2018-03-10 NOTE — ED Notes (Signed)
ED Provider at bedside. 

## 2018-03-10 NOTE — ED Triage Notes (Signed)
Pt complaint of pain at site of liver biopsy; biopsy was 2 days ago.

## 2018-03-10 NOTE — Discharge Instructions (Signed)
Return to the ED immediately for new or worsening symptoms, increased pain, shortness of breath, fevers or any concerns at all.

## 2018-03-13 DIAGNOSIS — Z7189 Other specified counseling: Secondary | ICD-10-CM | POA: Insufficient documentation

## 2018-03-13 NOTE — Assessment & Plan Note (Deleted)
I discussed with the patient the diagnosis, treatment, and prognosis of metastatic neuroendocrine tumor.  The patient understands that the disease is not curable, but treatable. In addition, I emphasized with patient the goal of treatment is for palliative intent only. I encouraged the patient to discuss advanced directives with her family, including CODE STATUS.

## 2018-03-13 NOTE — Assessment & Plan Note (Deleted)
Patient underwent ultrasound-guided biopsy on 03/08/2018, which showed well-differentiated neuroendocrine tumor, grade 2.  Octreotide scan has been ordered.  Given the patient symptomatic neuroendocrine tumor (diarrhea), I recommended starting monthly lanreotide for symptom control.   Additional therapy options include Afinitor versus Temodar/Xeloda, given the extensive hepatic metastases.  After detailed discussion, patient agreed to start everolimus.   We discussed some of the risks, benefits, side-effects of everolimus (Afinitor).   Some of the side-effects included, though not limited to, stomatitis, rash, fatigue, peripheral edema, diarrhea, infection (respiratory tract infection and pneumonia in particular), anemia, decreased appetite, and non-infectious pneumonitis.   The patient is aware that the response rates discussed earlier is not guaranteed.  After a long discussion, patient made an informed decision to proceed with the prescribed plan of care and went ahead to sign the consent form today.   Treatment decision is based on NCCN guidelines, 10 mg PO once daily, continue treatment until disease progression or unacceptable toxicity. References as follows:  Everolimus in advanced, progressive, well-differentiated, non-functional neuroendocrine tumors: RADIANT-4 lung subgroup analysis. [place], Buzzoni R, Delle [place], Tesselaar ME, Cleopatra [place] E, Tomassetti P, Strosberg J, Voi M, Bubuteishvili-Pacaud L, Ridolfi A, Herbst F, Tomasek J, Singh S, Pavel M, [place] MH, [place], Yao JC. Cancer Sci. 2018 Jan;109(1):174-181. doi: 10.1111/cas.27741. Epub 2017 Nov 9.  In the phase III RADIANT-4 study, everolimus improved median progression-free survival (PFS) by 7.1 months in patients with advanced, progressive, well-differentiated (grade 1 or grade 2), non-functional lung or gastrointestinal neuroendocrine tumors (NETs) vs placebo (hazard ratio, 0.48; 95% confidence interval [CI],  0.35-0.67; P < .00001). This exploratory analysis reports the outcomes of the subgroup of patients with lung NETs. In RADIANT-4, patients were randomized (2:1) to everolimus 10 mg/d or placebo, both with best supportive care. This is a post hoc analysis of the lung subgroup with PFS, by central radiology review, as the primary endpoint; secondary endpoints included objective response rate and safety measures. Ninety of the 302 patients enrolled in the study had primary lung NET (everolimus, n = 63; placebo, n = 27). Median PFS (95% CI) by central review was 9.2 (6.8-10.9) months in the everolimus arm vs 3.6 (1.9-5.1) months in the placebo arm (hazard ratio, 0.50; 95% CI, 0.28-0.88). More patients who received everolimus (58%) experienced tumor shrinkage compared with placebo (13%). Most frequently reported (?5% incidence) grade 3-4 drug-related adverse events (everolimus vs. placebo) included stomatitis (11% vs. 0%), hyperglycemia (10% vs. 0%), and any infections (8% vs. 0%). In patients with advanced, progressive, well-differentiated, non-functional lung NET, treatment with everolimus was associated with a median PFS improvement of 5.6 months, with a safety profile similar to that of the overall RADIANT-4 cohort. These results support the use of everolimus in patients with advanced, non-functional lung NET. The trial is registered with [place].gov (no. [id number]).  We will also prescribe PRN Imodium for drug-related diarrhea.

## 2018-03-14 ENCOUNTER — Other Ambulatory Visit: Payer: Self-pay | Admitting: Hematology

## 2018-03-14 ENCOUNTER — Telehealth: Payer: Self-pay | Admitting: Hematology

## 2018-03-14 ENCOUNTER — Other Ambulatory Visit: Payer: Self-pay | Admitting: *Deleted

## 2018-03-14 ENCOUNTER — Telehealth: Payer: Self-pay | Admitting: *Deleted

## 2018-03-14 DIAGNOSIS — C7B8 Other secondary neuroendocrine tumors: Secondary | ICD-10-CM

## 2018-03-14 NOTE — Telephone Encounter (Signed)
Emily Harper's daughter-n-law, Emily Harper [phone]) provides transportation.  Returning collaborative call to schedule scan and F/U.  [name] "Currently at work; calling this generic number provided, not able to speak with anyone.  I can call a scheduler when I get off work in an hour and a half."   Reached [place] on hold for [place].  Provided with [facility] scheduler information.

## 2018-03-14 NOTE — Telephone Encounter (Signed)
Spoke with Bertram Millard, daughter in [employer].  Gave [name] all instructions about Octreotide scan on Monday  03/20/18, need to increase po water day before,  Take laxatives OTC day before. Pt to register at [facility] at 1230 pm for scan at 1 pm. Informed [name] that pt will have follow up appt with Dr. Maylon Peppers at [facility] later next week.   [name] stated pt can come either  10/23 or  10/24. Schedule message sent.

## 2018-03-14 NOTE — Telephone Encounter (Signed)
Received call from [facility].  "Has spoke with [name]."  Encounter closed.

## 2018-03-14 NOTE — Telephone Encounter (Signed)
Per 10/8 sch message - completed - per Pati Gallo working on patient appts. And will send another message if appts needed.

## 2018-03-15 ENCOUNTER — Inpatient Hospital Stay: Payer: Medicare Other | Admitting: Hematology

## 2018-03-15 ENCOUNTER — Other Ambulatory Visit: Payer: Self-pay | Admitting: Hematology

## 2018-03-16 ENCOUNTER — Ambulatory Visit: Payer: Medicare Other | Admitting: Hematology

## 2018-03-20 ENCOUNTER — Other Ambulatory Visit: Payer: Self-pay | Admitting: Hematology

## 2018-03-20 ENCOUNTER — Ambulatory Visit (HOSPITAL_COMMUNITY): Payer: Medicare Other

## 2018-03-20 DIAGNOSIS — C7B8 Other secondary neuroendocrine tumors: Secondary | ICD-10-CM

## 2018-03-21 ENCOUNTER — Encounter (HOSPITAL_COMMUNITY): Payer: Medicare Other

## 2018-03-22 ENCOUNTER — Encounter (HOSPITAL_COMMUNITY): Payer: Medicare Other

## 2018-03-28 ENCOUNTER — Other Ambulatory Visit: Payer: Self-pay | Admitting: Hematology

## 2018-03-29 ENCOUNTER — Encounter (HOSPITAL_COMMUNITY)
Admission: RE | Admit: 2018-03-29 | Discharge: 2018-03-29 | Disposition: A | Discharge status: Home / Self Care | Payer: Medicare Other | Source: Ambulatory Visit | Attending: Hematology | Admitting: Hematology

## 2018-03-29 DIAGNOSIS — C7B8 Other secondary neuroendocrine tumors: Secondary | ICD-10-CM | POA: Insufficient documentation

## 2018-03-29 MED ORDER — GALLIUM GA 68 DOTATATE IV KIT
2.6500 | PACK | Freq: Once | INTRAVENOUS | Status: AC
  Administered 2018-03-29: 2.65 via INTRAVENOUS

## 2018-03-31 ENCOUNTER — Inpatient Hospital Stay: Payer: Medicare Other

## 2018-03-31 ENCOUNTER — Inpatient Hospital Stay: Payer: Medicare Other | Attending: Hematology | Admitting: Hematology

## 2018-03-31 VITALS — BP 129/74 | HR 76 | Temp 97.6°F | Resp 16 | Wt 105.1 lb

## 2018-03-31 DIAGNOSIS — C7B8 Other secondary neuroendocrine tumors: Secondary | ICD-10-CM

## 2018-03-31 DIAGNOSIS — R197 Diarrhea, unspecified: Secondary | ICD-10-CM

## 2018-03-31 DIAGNOSIS — Z7189 Other specified counseling: Secondary | ICD-10-CM

## 2018-03-31 DIAGNOSIS — C7A8 Other malignant neuroendocrine tumors: Secondary | ICD-10-CM | POA: Diagnosis not present

## 2018-03-31 LAB — CBC WITH DIFFERENTIAL (CANCER CENTER ONLY)
ABS IMMATURE GRANULOCYTES: 0.02 10*3/uL (ref 0.00–0.07)
BASOS ABS: 0.1 10*3/uL (ref 0.0–0.1)
Basophils Relative: 1 %
EOS PCT: 2 %
Eosinophils Absolute: 0.1 10*3/uL (ref 0.0–0.5)
HEMATOCRIT: 43.9 % (ref 36.0–46.0)
Hemoglobin: 14.6 g/dL (ref 12.0–15.0)
Immature Granulocytes: 0 %
LYMPHS PCT: 22 %
Lymphs Abs: 1.6 10*3/uL (ref 0.7–4.0)
MCH: 31 pg (ref 26.0–34.0)
MCHC: 33.3 g/dL (ref 30.0–36.0)
MCV: 93.2 fL (ref 80.0–100.0)
Monocytes Absolute: 0.5 10*3/uL (ref 0.1–1.0)
Monocytes Relative: 6 %
NRBC: 0 % (ref 0.0–0.2)
Neutro Abs: 5 10*3/uL (ref 1.7–7.7)
Neutrophils Relative %: 69 %
Platelet Count: 223 10*3/uL (ref 150–400)
RBC: 4.71 MIL/uL (ref 3.87–5.11)
RDW: 13.3 % (ref 11.5–15.5)
WBC Count: 7.3 10*3/uL (ref 4.0–10.5)

## 2018-03-31 LAB — CMP (CANCER CENTER ONLY)
ALT: 29 U/L (ref 10–47)
AST: 29 U/L (ref 11–38)
Albumin: 3.8 g/dL (ref 3.5–5.0)
Alkaline Phosphatase: 101 U/L — ABNORMAL HIGH (ref 26–84)
Anion gap: 4 — ABNORMAL LOW (ref 5–15)
BILIRUBIN TOTAL: 0.5 mg/dL (ref 0.2–1.6)
BUN: 12 mg/dL (ref 7–22)
CALCIUM: 9.9 mg/dL (ref 8.0–10.3)
CO2: 34 mmol/L — ABNORMAL HIGH (ref 18–33)
Chloride: 101 mmol/L (ref 98–108)
Creatinine: 1.2 mg/dL (ref 0.60–1.20)
Glucose, Bld: 107 mg/dL (ref 73–118)
Potassium: 4.2 mmol/L (ref 3.3–4.7)
Sodium: 139 mmol/L (ref 128–145)
Total Protein: 6.8 g/dL (ref 6.4–8.1)

## 2018-03-31 MED ORDER — LANREOTIDE ACETATE 120 MG/0.5ML ~~LOC~~ SOLN
120.0000 mg | Freq: Once | SUBCUTANEOUS | Status: AC
  Administered 2018-03-31: 120 mg via SUBCUTANEOUS

## 2018-03-31 NOTE — Progress Notes (Signed)
[place] OFFICE PROGRESS NOTE  Patient Care Team: System, Pcp Not In as PCP - General  HEME/ONC OVERVIEW: 1. 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 -Patient underwent a liver biopsy in early October 2019, which showed well-differentiated, grade 2, neuroendocrine tumor -I independently reviewed the radiologic images of dotatate PET, and agree with findings as documented -Briefly, dotatate PET in late October 2019 showed multiple liver lesions and suspected skeletal mets in the right calvarium, lumbar spine and sacrum; there was intense uptake in the small bowel in the right lower quadrant, favoring primary ileal disease -I discussed with the patient and Emily Harper family regarding the diagnosis, treatment options, and prognosis of metastatic neuroendocrine tumor -Given Emily Harper symptoms (primarily diarrhea) associated with neuroendocrine tumor, we will initiate monthly lanreotide injection -Patient is asymptomatic from the liver metastases at this time, and denies abdominal pain/fullness, and Emily Harper liver function is normal; however, given the extensiveness of liver metastases, there is a low threshold to proceed with Lu-dotate treatment if she develops symptoms associated with metastatic disease -If patient remains asymptomatic, we will plan to repeat CT CAP in 3 months to monitor for any signs of disease progression -Patient is considering moving back to to [place], and she will coordinate treatment plan with  Emily Harper local oncologist there if she moves back   Diarrhea -Patient currently reports improvement in Emily Harper diarrhea, but still having a few episodes per day -Lanreotide injection was started today -Patient understands to contact us if Emily Harper diarrhea does not improve, or becomes worse  Goals of care discussion -I discussed with the patient and Emily Harper family that the goal of treatment is for palliative intent only and that the cancer is not curable  Contact: (Daughter, [place]) [employer] [employer] [phone]  Orders Placed This Encounter  Procedures  . CT CHEST W CONTRAST    Standing Status:   Future    Standing Expiration Date:   03/31/2019    Order Specific Question:   If indicated for the ordered procedure, I authorize the administration of contrast media per Radiology protocol    Answer:   Yes    Order Specific Question:   Preferred imaging location?    Answer:   [facility] Specific Question:   Radiology Contrast Protocol - do NOT remove file path    Answer:   \\[email or web address].pdf  . CT ABDOMEN PELVIS W CONTRAST    Standing Status:   Future    Standing Expiration Date:   03/31/2019    Order Specific Question:   If indicated for the ordered procedure, I authorize the administration of contrast media per Radiology protocol    Answer:   Yes    Order Specific Question:   Preferred imaging location?    Answer:   [facility] Specific Question:   Is Oral Contrast requested for this exam?    Answer:   Yes, Per Radiology protocol    Order Specific Question:   Radiology Contrast Protocol - do NOT remove file path    Answer:   \\[email or web address].pdf  .  CBC with Differential (Cancer Center Only)    Standing Status:   Future    Standing Expiration Date:   05/05/2019  . CMP ([facility] only)    Standing Status:   Future    Standing Expiration Date:   05/05/2019  . Chromogranin A    Standing Status:   Future     Standing Expiration Date:   03/31/2019  . Serotonin serum    Standing Status:   Future    Standing Expiration Date:   03/31/2019   All questions were answered. The patient knows to call the clinic with any problems, questions or concerns. No barriers to learning was detected.  A total of more than 25 minutes were spent face-to-face with the patient during this encounter and over half of that time was spent on counseling and coordination of care as outlined above.   Tish Men, MD 03/31/2018 10:41 AM  CHIEF COMPLAINT: "I am here for my scan results"  INTERVAL HISTORY: Emily Harper returns to clinic for follow-up of Emily Harper recent dotatate PET scan.  She reports that she still has intermittent diarrhea, small-volume, throughout the day, but denies any fever, chill, weight loss, night sweats, chest pain, dyspnea, abdominal pain, nausea, vomiting, hematochezia, or melena.  She has chronic paraspinal low back pain, exacerbated by activities, including bending forward and lifting things, but she denies any pain in the bony spine, bladder or bowel incontinence, or lower extremity weakness/sensation changes.  SUMMARY OF ONCOLOGIC HISTORY:   Metastatic malignant neuroendocrine tumor to liver ([place])   10/05/2017 Miscellaneous    PCP noted pt to be losing weight; pt began to experience new onset diarrhea, 3-5x/day, moderate volume, no hematochezia or melena     12/13/2017 Procedure    Colonoscopy Normal except polyps removed from the cecum and descending colon (path showing tubular adenomas)    12/30/2017 Imaging    CT abdomen/pelvis without contrast (for diarrhea, weight loss) Multiple hypodense lesions within the liver, suspicious for metastatic disease.  Suspect circumferential wall thickening/mass lesion involving the cecum and ascending colon.  Bilateral renal cysts.  Mild anterior bladder wall thickening.  Subcentimeter sclerotic lesion in the sacrum, indeterminate.     12/30/2017 Cancer Staging     Staging form: Liver, AJCC 8th Edition - Clinical stage from 12/30/2017: Stage IVB (cTX, cNX, pM1) - Signed by Tish Men, MD on 03/13/2018    01/12/2018 Imaging    CT abdomen/pelvis with contrast  Multiple heterogeneously enhancing hepatic lesions consistent with metastases (largest measuring 4cm in the right lobe) Normal appearing pancreas    01/24/2018 Imaging    CT chest w/ contrast  Several 12m lung nodules, indeterminate significance     01/30/2018 Imaging    FDG PET: No focal abnormal FDG activity corresponding with the hepatic parenchyma.     03/08/2018 Procedure    US-guided liver biopsy     03/08/2018 Pathology Results    (Accession: [id number]  WELL-DIFFERENTIATED NEUROENDOCRINE TUMOR, GRADE 2. Ki-67 index moderate.     03/29/2018 Imaging    Dotatate PET:  1. Intense radiotracer activity within multiple hepatic lesions consistent with metastatic well-differentiated neuroendocrine tumor. 2. Intense activity associated with the small bowel of the RIGHT lower quadrant is favored PRIMARY SMALL-BOWEL LESION most likely in the ileum. 3. Evidence of skeletal metastasis to the RIGHT calvarium, lumbar spine, and sacrum. 4. Consider evaluation for Lu 177 DOTATATE peptide receptor radiotherapy.     REVIEW OF SYSTEMS:   Constitutional: ( - ) fevers, ( - )  chills , ( - ) night sweats Eyes: ( - ) blurriness of vision, ( - ) double vision, ( - ) watery eyes Ears, nose, mouth, throat, and face: ( - ) mucositis, ( - ) sore throat Respiratory: ( - ) cough, ( - ) dyspnea, ( - ) wheezes Cardiovascular: ( - ) palpitation, ( - ) chest discomfort, ( - ) lower extremity swelling Gastrointestinal:  ( - ) nausea, ( - ) heartburn, ( + ) diarrhea Skin: ( - ) abnormal skin rashes Lymphatics: ( - ) new lymphadenopathy, ( - ) easy bruising Neurological: ( - ) numbness, ( - ) tingling, ( - ) new weaknesses Behavioral/Psych: ( - ) mood change, ( - ) new changes  All other systems were reviewed with  the patient and are negative.  I have reviewed the past medical history, past surgical history, social history and family history with the patient and they are unchanged from previous note.  ALLERGIES:  is allergic to penicillins.  MEDICATIONS:  Current Outpatient Medications  Medication Sig Dispense Refill  . diazepam (VALIUM) 10 MG tablet Take 10 mg by mouth 3 (three) times daily.     Marland Kitchen latanoprost (XALATAN) 0.005 % ophthalmic solution Place 1 drop into both eyes at bedtime.    . simvastatin (ZOCOR) 40 MG tablet Take 40 mg by mouth at bedtime.     . triamterene-hydrochlorothiazide (MAXZIDE-25) 37.5-25 MG tablet Take 1 tablet by mouth daily.     No current facility-administered medications for this visit.     PHYSICAL EXAMINATION: ECOG PERFORMANCE STATUS: 1 - Symptomatic but completely ambulatory  Today's Vitals   03/31/18 0930 03/31/18 0934  BP:  129/74  Pulse:  76  Resp:  16  Temp:  97.6 F (36.4 C)  TempSrc:  Oral  SpO2:  100%  Weight:  105 lb 1.9 oz (47.7 kg)  PainSc: 0-No pain    Body mass index is 17.23 kg/m.  Filed Weights   03/31/18 0934  Weight: 105 lb 1.9 oz (47.7 kg)    GENERAL: alert, no distress and comfortable, thin SKIN: skin color normal, no rashes or significant lesions, chronically sun-damaged skin  EYES: conjunctiva are pink and non-injected, sclera clear OROPHARYNX: no exudate, no erythema; lips, buccal mucosa, and tongue normal  NECK: supple, non-tender LYMPH:  no palpable lymphadenopathy in the cervical or axillary  LUNGS: clear to auscultation and percussion with normal breathing effort HEART: regular rate & rhythm and no murmurs and no lower extremity edema ABDOMEN: soft, non-tender, non-distended, normal bowel sounds Musculoskeletal: no cyanosis of digits and no clubbing  PSYCH: alert & oriented x 3, fluent speech NEURO: no focal motor/sensory deficits  LABORATORY DATA:  I have reviewed the data as listed    Component Value Date/Time    NA 139 03/31/2018 0914   K 4.2 03/31/2018 0914   CL 101 03/31/2018 0914   CO2 34 (H) 03/31/2018 0914   GLUCOSE 107 03/31/2018 0914   BUN 12 03/31/2018 0914   CREATININE 1.20 03/31/2018 0914   CALCIUM 9.9 03/31/2018 0914   PROT 6.8 03/31/2018 0914   ALBUMIN 3.8 03/31/2018 0914   AST 29 03/31/2018 0914   ALT 29 03/31/2018 0914   ALKPHOS 101 (H) 03/31/2018 0914   BILITOT 0.5 03/31/2018 0914   GFRNONAA 34 (L) 03/10/2018 0903   GFRNONAA 36 (L) 02/23/2018 0835   GFRAA 40 (L) 03/10/2018 0903   GFRAA 42 (L) 02/23/2018 0835    No results found for: SPEP, UPEP  Lab  Results  Component Value Date   WBC 7.3 03/31/2018   NEUTROABS 5.0 03/31/2018   HGB 14.6 03/31/2018   HCT 43.9 03/31/2018   MCV 93.2 03/31/2018   PLT 223 03/31/2018      Chemistry      Component Value Date/Time   NA 139 03/31/2018 0914   K 4.2 03/31/2018 0914   CL 101 03/31/2018 0914   CO2 34 (H) 03/31/2018 0914   BUN 12 03/31/2018 0914   CREATININE 1.20 03/31/2018 0914      Component Value Date/Time   CALCIUM 9.9 03/31/2018 0914   ALKPHOS 101 (H) 03/31/2018 0914   AST 29 03/31/2018 0914   ALT 29 03/31/2018 0914   BILITOT 0.5 03/31/2018 0914       RADIOGRAPHIC STUDIES: I have personally reviewed the radiological images as listed below and agreed with the findings in the report. Ct Abdomen W Contrast  Result Date: 03/10/2018 CLINICAL DATA:  Recent liver biopsy, abdominal discomfort and pain for 2 days. EXAM: CT ABDOMEN WITH CONTRAST TECHNIQUE: Multidetector CT imaging of the abdomen was performed using the standard protocol following bolus administration of intravenous contrast. CONTRAST:  50m OMNIPAQUE IOHEXOL 300 MG/ML  SOLN COMPARISON:  PET 01/27/2018 and CT chest 01/24/2018. FINDINGS: Lower chest: Calcified granuloma in the right lower lobe. Heart size normal. No pericardial or pleural effusion. Hepatobiliary: Heterogeneous lesions in the right hepatic lobe measure up to 3.9 cm. Probable subtle hypodense  lesion in the left hepatic lobe (series 2, image 23). Periportal edema. Liver measures at least 21.8 cm but is incompletely imaged. Gallbladder is unremarkable. No biliary ductal dilatation. Pancreas: Negative. Spleen: There are 3 low-attenuation lesions in the spleen measuring up to 1.4 cm, indeterminate. Adrenals/Urinary Tract: Adrenal glands are unremarkable. Low-attenuation lesions in the kidneys measure up to 1.5 cm on the left and are likely cysts although definitive characterization is limited due to size. Stomach/Bowel: Stomach is decompressed. Visualized portions of the small bowel and colon are unremarkable. Vascular/Lymphatic: Atherosclerotic calcification of the arterial vasculature. Retroaortic left renal vein. No pathologically enlarged lymph nodes. Other: No free fluid. Mesenteries and peritoneum are grossly unremarkable. Musculoskeletal: Vague area of sclerosis in the right sacrum is unchanged. Degenerative changes in the spine. No worrisome lytic or sclerotic lesions. IMPRESSION: 1. No acute findings to explain the patient's pain. 2. Heterogeneous hypodense hepatic lesions are worrisome for metastatic disease. 3. Splenic lesions are difficult to further characterize due to size. 4. Hepatomegaly. 5.  Aortic atherosclerosis (ICD10-170.0). 6. Vague area of sclerosis in the right sacrum. Continued attention on follow-up exams is warranted. Electronically Signed   By: MLorin PicketM.D.   On: 03/10/2018 10:38   [place]liver[place]  Result Date: 03/08/2018 INDICATION: No known primary, now with multiple liver lesions and masses worrisome for metastatic disease. Please from ultrasound-guided biopsy for tissue diagnostic purposes. EXAM: ULTRASOUND GUIDED LIVER LESION BIOPSY COMPARISON:  PET-CT-01/27/2018; chest CT-01/24/2018 MEDICATIONS: None ANESTHESIA/SEDATION: Fentanyl 50 mcg IV; Versed 1 mg IV Total Moderate Sedation time:  13 Minutes. The patient's level of consciousness and vital signs were  monitored continuously by radiology nursing throughout the procedure under my direct supervision. COMPLICATIONS: None immediate. PROCEDURE: Informed written consent was obtained from the patient after a discussion of the risks, benefits and alternatives to treatment. The patient understands and consents the procedure. A timeout was performed prior to the initiation of the procedure. Ultrasound scanning was performed of the right upper abdominal quadrant demonstrates multiple predominantly hypoechoic lesions and masses scattered within the right  lobe of the liver. Dominant hyperechoic approximately 4.2 x 4.0 cm mass within the posterior inferior aspect of the right lobe of the liver correlating with the dominant mass seen on preceding PET-CT image 146, series 3 was targeted for biopsy given lesion location and sonographic window. The procedure was planned. The right upper abdominal quadrant was prepped and draped in the usual sterile fashion. The overlying soft tissues were anesthetized with 1% lidocaine with epinephrine. A 17 gauge, 6.8 cm co-axial needle was advanced into a peripheral aspect of the lesion. This was followed by 5 core biopsies with an 18 gauge core device under direct ultrasound guidance. The coaxial needle tract was embolized with a small amount of Gel-Foam slurry and superficial hemostasis was obtained with manual compression. Post procedural scanning was negative for definitive area of hemorrhage or additional complication. A dressing was placed. The patient tolerated the procedure well without immediate post procedural complication. IMPRESSION: Technically successful ultrasound guided core needle biopsy of dominant mass within the posterior inferior segment of the right lobe of the liver. Electronically Signed   By: Sandi Mariscal M.D.   On: 03/08/2018 16:06   Nm Pet (netspot Ga 68 Dotatate) Skull Base To Mid Thigh  Result Date: 03/29/2018 CLINICAL DATA:  Liver metastasis. Diarrhea. Concern  for neuroendocrine tumor. Liver biopsy revealed well differentiated neuroendocrine tumor (grade 2) EXAM: NUCLEAR MEDICINE PET SKULL BASE TO THIGH TECHNIQUE: 2.65 mCi Ga 68 DOTATATE was injected intravenously. Full-ring PET imaging was performed from the skull base to thigh after the radiotracer. CT data was obtained and used for attenuation correction and anatomic localization. COMPARISON:  CT 01/12/2018, FDG PET-CT 01/27/2018 FINDINGS: NECK Focus of intense radiotracer uptake within the inferior RIGHT frontal bone just posterolateral to the orbit with SUV max equal 17.8 (image 7). Incidental CT findings: None CHEST No radiotracer accumulation within mediastinal or hilar lymph nodes. No suspicious pulmonary nodules on the CT scan. Incidental CT finding:None ABDOMEN/PELVIS Multiple foci of intense radiotracer uptake within the liver throughout the LEFT and RIGHT hepatic lobe. Large 6 cm lesion occupies the posterior aspect the RIGHT hepatic lobe with SUV max equal 32.5. Example lesion in the LEFT lateral hepatic lobe measures approximately 2.9 cm SUV max equal 35. There approximately 40 lesions in liver. No abnormal radiotracer activity in the porta hepatis. Focal activity in the abdominal or pelvic lymph nodes. There is a focus of activity in the small bowel centrally SUV max equal 16.7. This localizes to CT image 158/4. No clear lesion identified on the CT portion exam. This lesion is favored in the distal ileum. Physiologic activity noted in the liver, spleen, adrenal glands and kidneys. Incidental CT findings:None SKELETON Several discrete lesions intense radiotracer activity within the skeleton. Lesion in the L3 vertebral body SUV max equal 39.4 corresponds to a subtle sclerotic lesion measuring 10 mm on image 136/4. Several small sclerotic lesions in the sacrum which also have focal radiotracer activity. For explainable lesion in the RIGHT sacral ala with SUV max equal 12.3 (image 148/4. Incidental CT  findings:None IMPRESSION: 1. Intense radiotracer activity within multiple hepatic lesions consistent with metastatic well-differentiated neuroendocrine tumor. 2. Intense activity associated with the small bowel of the RIGHT lower quadrant is favored PRIMARY SMALL-BOWEL LESION most likely in the ileum. 3. Evidence of skeletal metastasis to the RIGHT calvarium, lumbar spine, and sacrum. 4. Consider evaluation for Lu 177 DOTATATE peptide receptor radiotherapy. Electronically Signed   By: Suzy Bouchard M.D.   On: 03/29/2018 17:25

## 2018-04-12 ENCOUNTER — Telehealth: Payer: Self-pay | Admitting: Hematology & Oncology

## 2018-04-12 NOTE — Telephone Encounter (Signed)
Pt's daughter Emily Harper) called to have medical records faxed to: [facility] [address] [place] [address] P: [place]

## 2018-04-28 ENCOUNTER — Ambulatory Visit: Payer: Medicare Other

## 2018-04-28 MED ORDER — LANREOTIDE ACETATE 120 MG/0.5ML ~~LOC~~ SOLN
SUBCUTANEOUS | Status: AC
  Filled 2018-04-28: qty 120

## 2018-05-26 ENCOUNTER — Ambulatory Visit: Payer: Medicare Other

## 2018-05-30 ENCOUNTER — Telehealth: Payer: Self-pay | Admitting: Hematology

## 2018-05-30 NOTE — Telephone Encounter (Signed)
[facility] and advise if she needs to keep f/u appointment for Jan. 2020 per 12/20 sch msg

## 2018-06-15 ENCOUNTER — Telehealth: Payer: Self-pay | Admitting: Family

## 2018-06-15 ENCOUNTER — Telehealth: Payer: Self-pay | Admitting: Hematology & Oncology

## 2018-06-15 NOTE — Telephone Encounter (Signed)
Per Wynelle Fanny all appointments should be cancelled due to patient returning to [place] for ALL treatment

## 2018-06-23 ENCOUNTER — Other Ambulatory Visit: Payer: Medicare Other

## 2018-06-23 ENCOUNTER — Ambulatory Visit: Payer: Medicare Other

## 2018-06-23 ENCOUNTER — Ambulatory Visit: Payer: Medicare Other | Admitting: Family

## 2018-06-23 ENCOUNTER — Other Ambulatory Visit (HOSPITAL_BASED_OUTPATIENT_CLINIC_OR_DEPARTMENT_OTHER): Payer: Medicare Other

## 2018-11-30 IMAGING — CT NM PET NOPR SKULL BASE TO THIGH
7 series · 25 of 25 positions shown · non-contrast
Comparison: CT 01/12/2018, FDG PET-CT 01/27/2018

CLINICAL DATA: Liver metastasis. Diarrhea. Concern for
neuroendocrine tumor. Liver biopsy revealed well differentiated
neuroendocrine tumor (grade 2)

EXAM:
NUCLEAR MEDICINE PET SKULL BASE TO THIGH
TECHNIQUE: 2.65 mCi Ga 68 DOTATATE was injected intravenously. Full-ring PET
imaging was performed from the skull base to thigh after the
radiotracer. CT data was obtained and used for attenuation
correction and anatomic localization.

[Series 3: pet sk_thigh ac · axial · 5.0mm · 4.07mm/px · z∈[-1564,-708]mm · 6 of 215 slices shown]
[im 1/215]
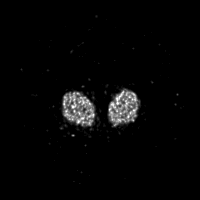
[im 43/215]
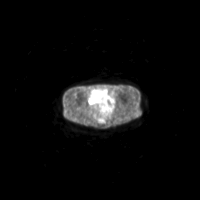
[im 86/215]
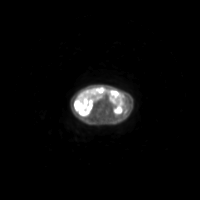
[im 129/215]
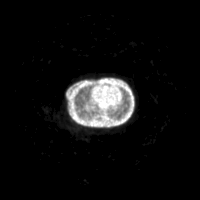
[im 172/215]
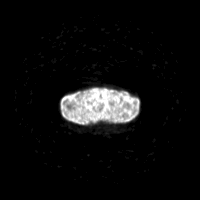
[im 215/215]
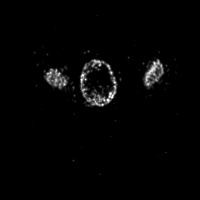

[Series 4: ct sk_thigh 5.0 b31f · axial · 5.0mm · 0.98mm/px · z∈[-1564,-708]mm · 5 of 215 slices shown]
[im 1/215]
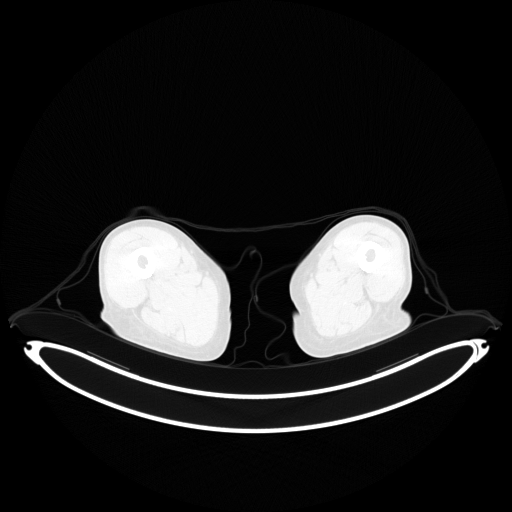
[im 54/215]
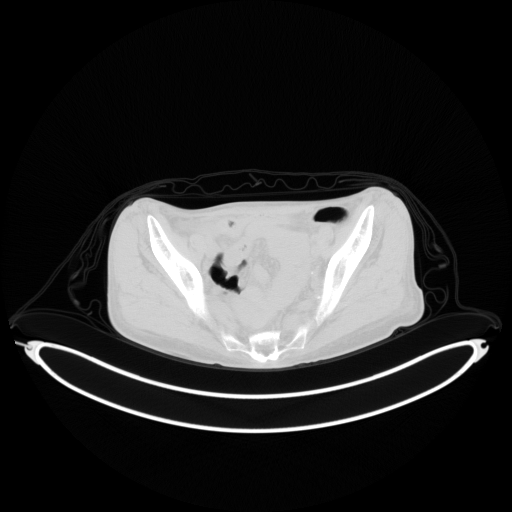
[im 108/215]
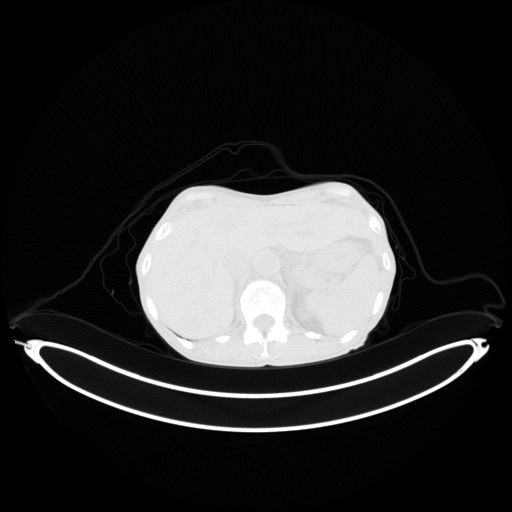
[im 161/215]
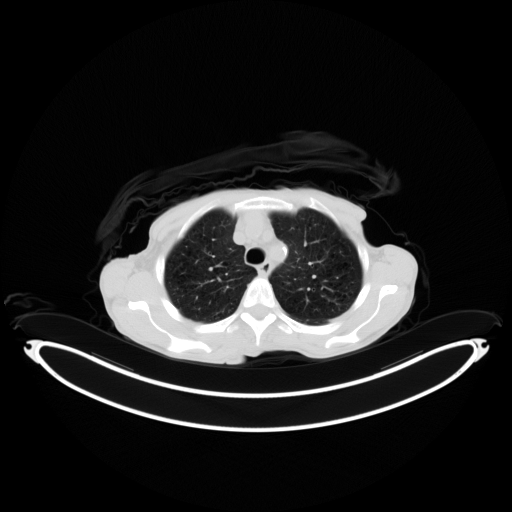
[im 215/215  brain]
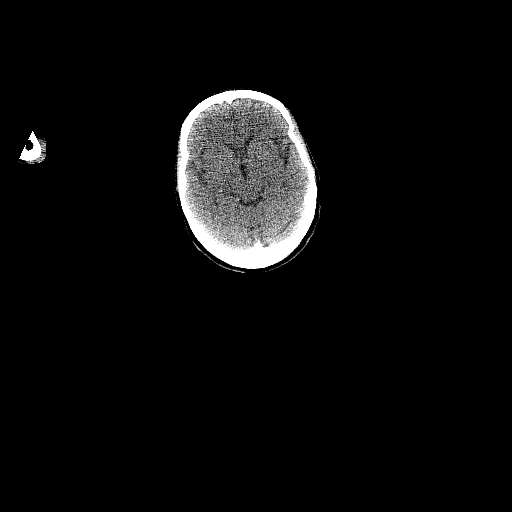

[Series 5: pet sk_thigh nac · axial · 5.0mm · 4.07mm/px · z∈[-1564,-708]mm · 5 of 215 slices shown]
[im 1/215]
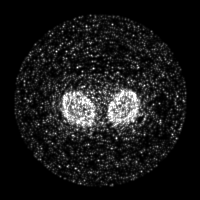
[im 54/215]
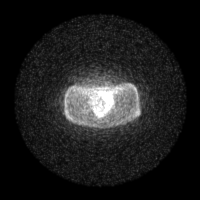
[im 108/215]
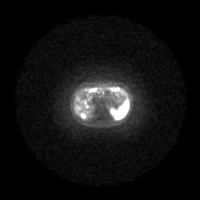
[im 161/215]
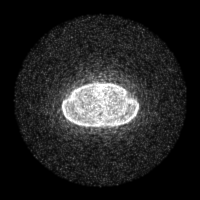
[im 215/215]
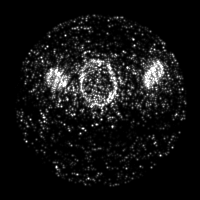

[Series 8: ct sk_thigh 5.0 b70f (id)_bone · axial · 5.0mm · 0.61mm/px · z∈[-1148,-844]mm · 2 of 77 slices shown]
[im 1/77  bone]
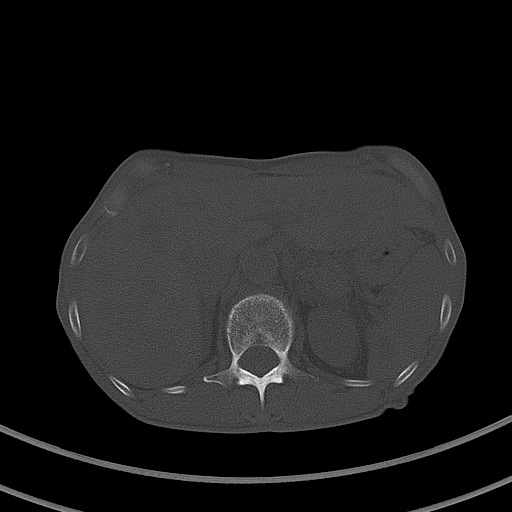
[im 77/77  bone]
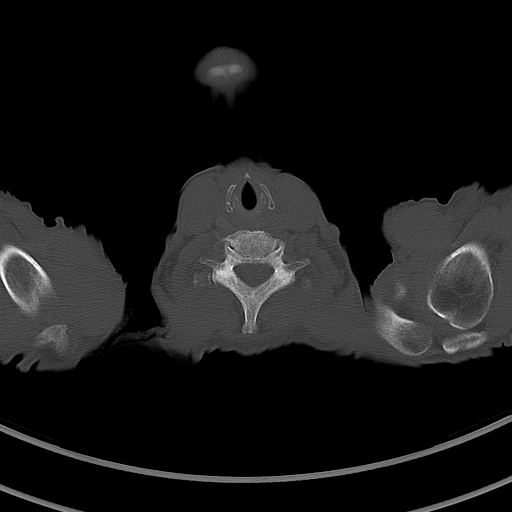

[Series 604: range-ct sk_thigh 5.0 (id)<alpha range> · 1 of 51 slices shown (1 of 2)]
[im 1/51]
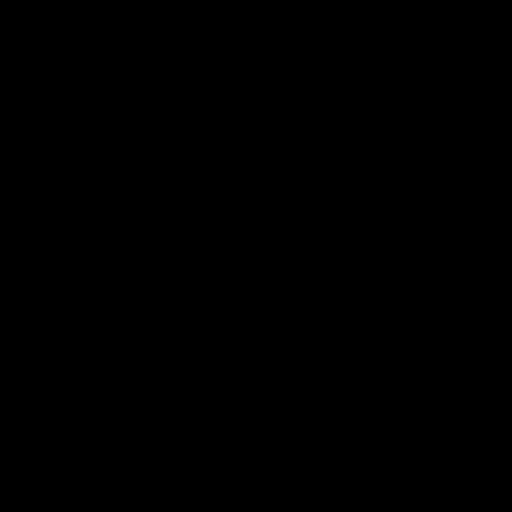

[Series 605: range-ct sk_thigh 5.0 (id)<alpha range> · 5 of 203 slices shown (2 of 2)]
[im 1/203]
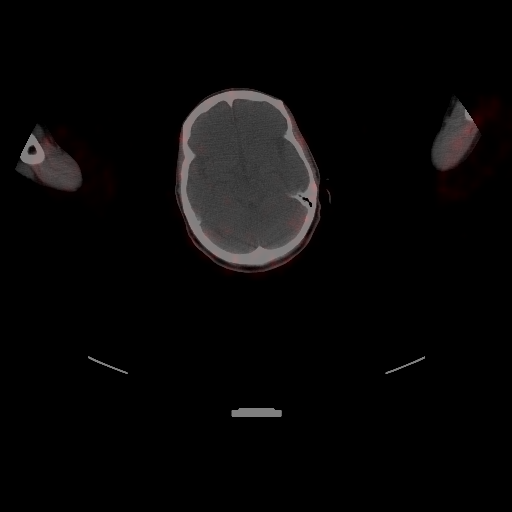
[im 51/203]
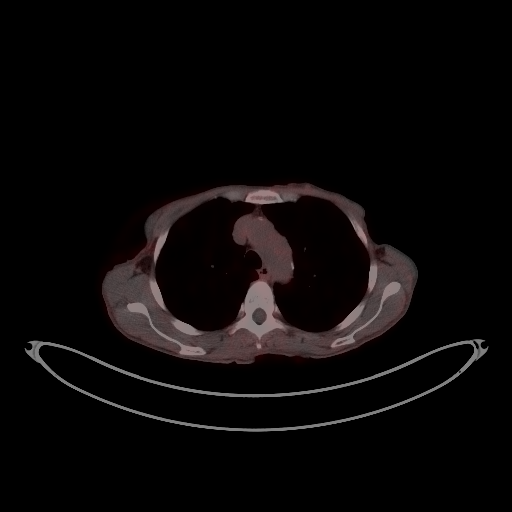
[im 102/203]
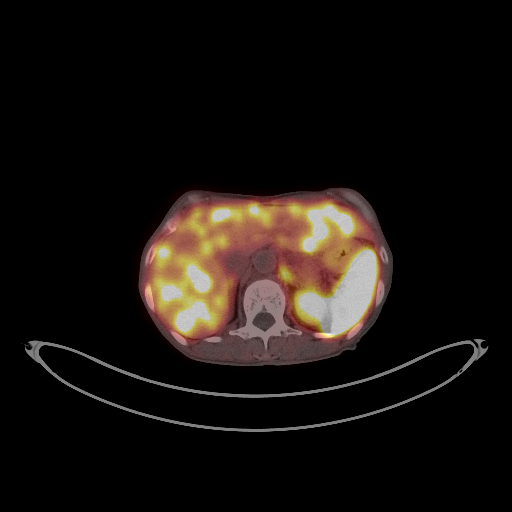
[im 152/203]
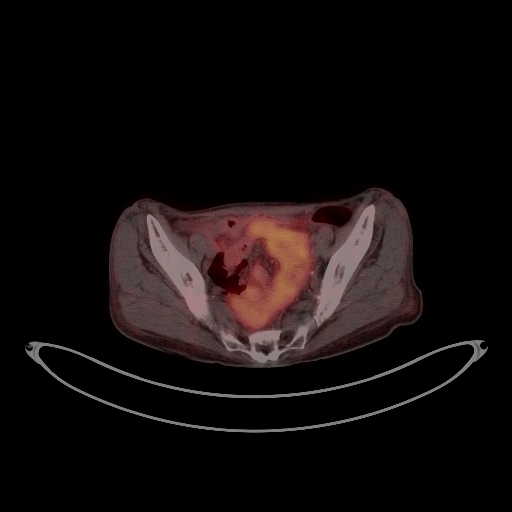
[im 203/203]
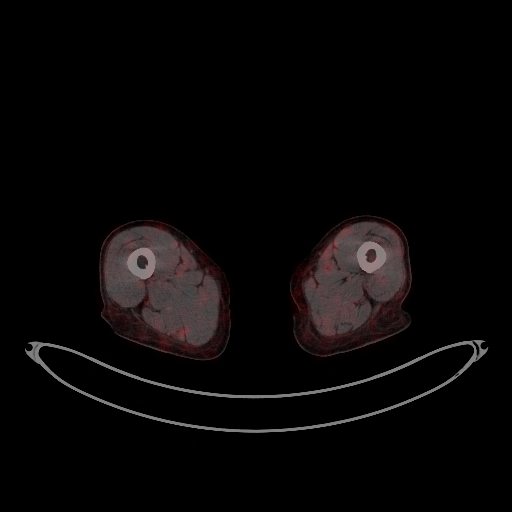

[Series 9882: results mm oncology reading · 1.0mm · 0.54mm/px · 1 of 9 slices shown]
[im 1/9]
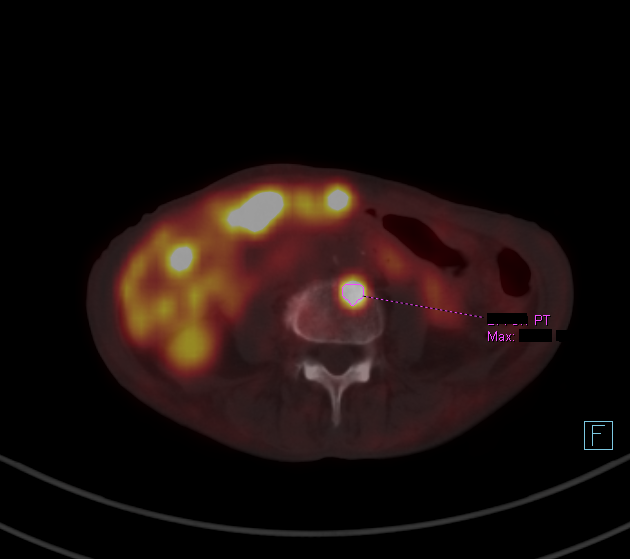

[25 of 25 positions shown; findings below may reference images not displayed]

FINDINGS: NECK

Focus of intense radiotracer uptake within the inferior RIGHT
frontal bone just posterolateral to the orbit with SUV max equal
17.8 (image 7).

Incidental CT findings: None

CHEST

No radiotracer accumulation within mediastinal or hilar lymph nodes.
No suspicious pulmonary nodules on the CT scan.

Incidental CT finding:None

ABDOMEN/PELVIS

Multiple foci of intense radiotracer uptake within the liver
throughout the LEFT and RIGHT hepatic lobe. Large 6 cm lesion
occupies the posterior aspect the RIGHT hepatic lobe with SUV max
equal 32.5. Example lesion in the LEFT lateral hepatic lobe measures
approximately 2.9 cm SUV max equal 35. There approximately 40
lesions in liver.

No abnormal radiotracer activity in the porta hepatis. Focal
activity in the abdominal or pelvic lymph nodes.

There is a focus of activity in the small bowel centrally SUV max
equal 16.7. This localizes to CT image 158/4. No clear lesion
identified on the CT portion exam. This lesion is favored in the
distal ileum.

Physiologic activity noted in the liver, spleen, adrenal glands and
kidneys.

Incidental CT findings:None

SKELETON

Several discrete lesions intense radiotracer activity within the
skeleton. Lesion in the L3 vertebral body SUV max equal
corresponds to a subtle sclerotic lesion measuring 10 mm on image
136/4.

Several small sclerotic lesions in the sacrum which also have focal
radiotracer activity. For explainable lesion in the RIGHT sacral ala
with SUV max equal 12.3 (image 148/4.

Incidental CT findings:None
IMPRESSION: 1. Intense radiotracer activity within multiple hepatic lesions
consistent with metastatic well-differentiated neuroendocrine tumor.
2. Intense activity associated with the small bowel of the RIGHT
lower quadrant is favored PRIMARY SMALL-BOWEL LESION most likely in
the ileum.
3. Evidence of skeletal metastasis to the RIGHT calvarium, lumbar
spine, and sacrum.
4. Consider evaluation for Lu 177 DOTATATE peptide receptor
radiotherapy.
